# Patient Record
Sex: Female | Born: 1980 | Race: White | Hispanic: No | Marital: Single | Smoking: Never smoker
Health system: Southern US, Community
[De-identification: ages and names within clinical notes are randomized; demographics above are authoritative.]

## PROBLEM LIST (undated history)

## (undated) DIAGNOSIS — I00 Rheumatic fever without heart involvement: Secondary | ICD-10-CM

## (undated) DIAGNOSIS — I341 Nonrheumatic mitral (valve) prolapse: Secondary | ICD-10-CM

---

## 2019-08-30 ENCOUNTER — Other Ambulatory Visit: Payer: Self-pay

## 2019-08-30 ENCOUNTER — Emergency Department (HOSPITAL_COMMUNITY): Payer: PRIVATE HEALTH INSURANCE

## 2019-08-30 ENCOUNTER — Encounter (HOSPITAL_COMMUNITY): Payer: Self-pay | Admitting: Emergency Medicine

## 2019-08-30 ENCOUNTER — Emergency Department (HOSPITAL_COMMUNITY)
Admission: EM | Admit: 2019-08-30 | Discharge: 2019-08-30 | Disposition: A | Discharge status: Home / Self Care | Payer: PRIVATE HEALTH INSURANCE | Attending: Emergency Medicine | Admitting: Emergency Medicine

## 2019-08-30 DIAGNOSIS — I479 Paroxysmal tachycardia, unspecified: Secondary | ICD-10-CM | POA: Diagnosis not present

## 2019-08-30 DIAGNOSIS — R079 Chest pain, unspecified: Secondary | ICD-10-CM

## 2019-08-30 DIAGNOSIS — R002 Palpitations: Secondary | ICD-10-CM | POA: Diagnosis present

## 2019-08-30 DIAGNOSIS — R110 Nausea: Secondary | ICD-10-CM | POA: Diagnosis not present

## 2019-08-30 HISTORY — DX: Rheumatic fever without heart involvement: I00

## 2019-08-30 HISTORY — DX: Nonrheumatic mitral (valve) prolapse: I34.1

## 2019-08-30 LAB — BASIC METABOLIC PANEL
Anion gap: 8 (ref 5–15)
BUN: 12 mg/dL (ref 6–20)
CO2: 25 mmol/L (ref 22–32)
Calcium: 9.5 mg/dL (ref 8.9–10.3)
Chloride: 106 mmol/L (ref 98–111)
Creatinine, Ser: 0.99 mg/dL (ref 0.44–1.00)
GFR calc Af Amer: >60 mL/min (ref >60)
GFR calc non Af Amer: >60 mL/min (ref >60)
Glucose, Bld: 103 mg/dL — ABNORMAL HIGH (ref 70–99)
Potassium: 3.6 mmol/L (ref 3.5–5.1)
Sodium: 139 mmol/L (ref 135–145)

## 2019-08-30 LAB — CBC
HCT: 41.9 % (ref 36.0–46.0)
Hemoglobin: 13.8 g/dL (ref 12.0–15.0)
MCH: 31.2 pg (ref 26.0–34.0)
MCHC: 32.9 g/dL (ref 30.0–36.0)
MCV: 94.6 fL (ref 80.0–100.0)
Platelets: 198 10*3/uL (ref 150–400)
RBC: 4.43 MIL/uL (ref 3.87–5.11)
RDW: 11.8 % (ref 11.5–15.5)
WBC: 8 10*3/uL (ref 4.0–10.5)
nRBC: 0 % (ref 0.0–0.2)

## 2019-08-30 LAB — TROPONIN I (HIGH SENSITIVITY)
Troponin I (High Sensitivity): <2 ng/L (ref <18)
Troponin I (High Sensitivity): 3 ng/L (ref <18)

## 2019-08-30 LAB — TSH: TSH: 3.077 u[IU]/mL (ref 0.350–4.500)

## 2019-08-30 LAB — I-STAT BETA HCG BLOOD, ED (MC, WL, AP ONLY): I-stat hCG, quantitative: <5 m[IU]/mL (ref <5)

## 2019-08-30 LAB — T4, FREE: Free T4: 1.02 ng/dL (ref 0.61–1.12)

## 2019-08-30 LAB — PROTIME-INR
INR: 0.9 (ref 0.8–1.2)
Prothrombin Time: 12.2 seconds (ref 11.4–15.2)

## 2019-08-30 LAB — D-DIMER, QUANTITATIVE: D-Dimer, Quant: 0.54 ug/mL-FEU — ABNORMAL HIGH (ref 0.00–0.50)

## 2019-08-30 MED ORDER — SODIUM CHLORIDE 0.9% FLUSH: 3.0000 mL | Freq: Once | INTRAVENOUS | Status: DC

## 2019-08-30 MED ORDER — IOHEXOL 350 MG/ML SOLN
100.0000 mL | Freq: Once | INTRAVENOUS | Status: AC | PRN
  Administered 2019-08-30: 100 mL via INTRAVENOUS

## 2019-08-30 MED ORDER — SODIUM CHLORIDE 0.9 % IV BOLUS
1000.0000 mL | Freq: Once | INTRAVENOUS | Status: AC
  Administered 2019-08-30: 1000 mL via INTRAVENOUS

## 2019-08-30 NOTE — ED Triage Notes (Signed)
Patient woke up this morning with palpitations , intermittent  left chest pain for 1 month with SOB , denies emesis or diaphoresis , no cough or fever .

## 2019-08-30 NOTE — ED Provider Notes (Signed)
[facility] EMERGENCY DEPARTMENT Provider Note  CSN: [id number] Arrival date & time: 08/30/19 0250  Chief Complaint(s) Chest Pain / Palpitations  HPI Rebecca Hester is a 39 y.o. female   The history is provided by the patient.  Palpitations Palpitations quality:  Regular Onset quality:  At rest Duration: 2 months. Timing:  Intermittent Progression:  Waxing and waning Chronicity:  New Context: not anxiety, not appetite suppressants, not blood loss, not bronchodilators, not caffeine, not dehydration, not exercise, not hyperventilation, not illicit drugs and not stimulant use   Relieved by:  Nothing Worsened by:  Nothing Associated symptoms: chest pressure, dizziness, nausea and shortness of breath   Associated symptoms: no cough, no diaphoresis, no lower extremity edema, no vomiting and no weakness   Risk factors: no diabetes mellitus, no hx of PE, no hx of thyroid disease, no hyperthyroidism, no OTC sinus medications and no stress    Saw her PCP initially for the symptoms and had labs done noting low Vit D and B12. She has been getting B12 shots since.  Patient had mild URI vs allergic rhinitis symptoms 1 week ago.  Traveled to [place] just prior to onset of symptoms. No tick bites noted.  Past Medical History Past Medical History:  Diagnosis Date  . Mitral valve prolapse   . Rheumatic fever    There are no problems to display for this patient.  Home Medication(s) Prior to Admission medications   Medication Sig Start Date End Date Taking? Authorizing Provider  cyanocobalamin (,VITAMIN B-12,) 1000 MCG/ML injection Inject 1,000 mcg into the muscle every 30 (thirty) days. 07/30/19  Yes [provider]                                                                                                                                    Past Surgical History History reviewed. No pertinent surgical history. Family History No family history on  file.  Social History Social History   Tobacco Use  . Smoking status: Never Smoker  . Smokeless tobacco: Never Used  Substance Use Topics  . Alcohol use: Never  . Drug use: Never   Allergies Patient has no known allergies.  Review of Systems Review of Systems  Constitutional: Negative for diaphoresis.  Respiratory: Positive for shortness of breath. Negative for cough.   Cardiovascular: Positive for palpitations.  Gastrointestinal: Positive for nausea. Negative for vomiting.  Neurological: Positive for dizziness. Negative for weakness.   All other systems are reviewed and are negative for acute change except as noted in the HPI  Physical Exam Vital Signs  I have reviewed the triage vital signs BP 121/82   Pulse 102   Temp 99.1 F (37.3 C) (Oral)   Resp 16   Ht 5\' 6"  (1.676 m)   Wt 65 kg   LMP 08/29/2019   SpO2 100%   BMI 23.13 kg/m   Physical Exam Vitals reviewed.  Constitutional:  General: She is not in acute distress.    Appearance: She is well-developed. She is not diaphoretic.  HENT:     Head: Normocephalic and atraumatic.     Nose: Nose normal.  Eyes:     General: No scleral icterus.       Right eye: No discharge.        Left eye: No discharge.     Conjunctiva/sclera: Conjunctivae normal.     Pupils: Pupils are equal, round, and reactive to light.  Cardiovascular:     Rate and Rhythm: Regular rhythm. Tachycardia present.     Pulses:          Radial pulses are 2+ on the right side and 2+ on the left side.       Dorsalis pedis pulses are 2+ on the right side and 2+ on the left side.     Heart sounds: No murmur heard.  No friction rub. No gallop.   Pulmonary:     Effort: Pulmonary effort is normal. No respiratory distress.     Breath sounds: Normal breath sounds. No stridor. No rales.  Abdominal:     General: There is no distension.     Palpations: Abdomen is soft.     Tenderness: There is no abdominal tenderness.  Musculoskeletal:         General: No tenderness.     Cervical back: Normal range of motion and neck supple.  Skin:    General: Skin is warm and dry.     Findings: No erythema or rash.  Neurological:     Mental Status: She is alert and oriented to person, place, and time.     ED Results and Treatments Labs (all labs ordered are listed, but only abnormal results are displayed) Labs Reviewed  BASIC METABOLIC PANEL - Abnormal; Notable for the following components:      Result Value   Glucose, Bld 103 (*)    All other components within normal limits  D-DIMER, QUANTITATIVE (NOT AT [facility]) - Abnormal; Notable for the following components:   D-Dimer, Quant 0.54 (*)    All other components within normal limits  CBC  PROTIME-INR  T4, FREE  TSH  I-STAT BETA HCG BLOOD, ED ([facility], [facility], [facility] ONLY)  TROPONIN I (HIGH SENSITIVITY)  TROPONIN I (HIGH SENSITIVITY)                                                                                                                         EKG  EKG Interpretation  Date/Time:  Saturday August 30 2019 03:09:34 EDT Ventricular Rate:  93 PR Interval:  126 QRS Duration: 94 QT Interval:  372 QTC Calculation: 462 R Axis:   85 Text Interpretation: Normal sinus rhythm Normal ECG No old tracing to compare Confirmed by Drema Pry [phone]) on 08/30/2019 4:59:28 AM       EKG Interpretation  Date/Time:  Saturday August 30 2019 03:17:51 EDT Ventricular Rate:  142 PR Interval:  126 QRS Duration:  80 QT Interval:  288 QTC Calculation: 443 R Axis:   87 Text Interpretation: Sinus tachycardia Right atrial enlargement Possible Lateral infarct , age undetermined Possible Inferior infarct , age undetermined Abnormal ECG Confirmed by Drema Pryardama, Chase Knebel [phone]([id number]) on 08/30/2019 5:01:45 AM       Radiology CT Angio Chest PE W and/or Wo Contrast  Result Date: 08/30/2019 CLINICAL DATA:  Evaluate for pulmonary embolus. Positive D-dimer. Tachycardia and bradycardia. EXAM: CT ANGIOGRAPHY CHEST WITH CONTRAST  TECHNIQUE: Multidetector CT imaging of the chest was performed using the standard protocol during bolus administration of intravenous contrast. Multiplanar CT image reconstructions and MIPs were obtained to evaluate the vascular anatomy. CONTRAST:  100mL OMNIPAQUE IOHEXOL 350 MG/ML SOLN COMPARISON:  None. FINDINGS: Cardiovascular: Satisfactory opacification of the pulmonary arteries to the segmental level. No evidence of pulmonary embolism. Normal heart size. No pericardial effusion. Mediastinum/Nodes: Prominent thymus tissue identified within the anterior mediastinum. No mass identified. Normal appearance of the thyroid gland. The trachea appears patent and is midline. Normal appearance of the esophagus. No mediastinal or hilar adenopathy. Lungs/Pleura: No pleural effusion, airspace consolidation, or atelectasis. 3 mm nodule is identified within the lateral left upper lobe, image 33/8. Upper Abdomen: No acute abnormality. Musculoskeletal: No chest wall abnormality. No acute or significant osseous findings. Review of the MIP images confirms the above findings. IMPRESSION: 1. No evidence for acute pulmonary embolus. No acute cardiopulmonary abnormalities identified. 2. 3 mm left upper lobe lung nodule is identified. No follow-up needed if patient is low-risk. Non-contrast chest CT can be considered in 12 months if patient is high-risk. This recommendation follows the consensus statement: Guidelines for Management of Incidental Pulmonary Nodules Detected on CT Images: From the Fleischner Society 2017; Radiology 2017; 284:228-243. Electronically Signed   By: Signa Kellaylor  Stroud M.D.   On: 08/30/2019 05:43   DG Chest Port 1 View  Result Date: 08/30/2019 CLINICAL DATA:  Elevated heart rate and chest pain. EXAM: PORTABLE CHEST 1 VIEW COMPARISON:  None FINDINGS: The heart size and mediastinal contours are within normal limits. Both lungs are clear. The visualized skeletal structures are unremarkable. IMPRESSION: No active  disease. Electronically Signed   By: Signa Kellaylor  Stroud M.D.   On: 08/30/2019 04:50    Pertinent labs & imaging results that were available during my care of the patient were reviewed by me and considered in my medical decision making (see chart for details).  Medications Ordered in ED Medications  sodium chloride flush (NS) 0.9 % injection 3 mL (has no administration in time range)  sodium chloride 0.9 % bolus 1,000 mL (0 mLs Intravenous Stopped 08/30/19 0616)  iohexol (OMNIPAQUE) 350 MG/ML injection 100 mL (100 mLs Intravenous Contrast Given 08/30/19 0524)                                                                                                                                    Procedures .1-3 Lead EKG Interpretation Performed by: Eudelia Bunchardama,  Amadeo Garnet, MD Authorized by: Nira Conn, MD     Interpretation: abnormal     ECG rate:  70-160   ECG rate assessment: tachycardic     Rhythm: sinus tachycardia     Ectopy: none     Conduction: normal      (including critical care time)  Medical Decision Making / ED Course I have reviewed the nursing notes for this encounter and the patient's prior records (if available in EHR or on provided paperwork).   Rebecca Hester was evaluated in Emergency Department on 08/30/2019 for the symptoms described in the history of present illness. She was evaluated in the context of the global COVID-19 pandemic, which necessitated consideration that the patient might be at risk for infection with the SARS-CoV-2 virus that causes COVID-19. Institutional protocols and algorithms that pertain to the evaluation of patients at risk for COVID-19 are in a state of rapid change based on information released by regulatory bodies including the CDC and federal and state organizations. These policies and algorithms were followed during the patient's care in the ED.  Patient is intermittently tachycardic with rates up to the 160s. Appears to be sinus rhythm.  At times variant with respirations.  Vagal maneuvers unsuccessful in lowering heart rate.  Patient is having associated chest discomfort likely related to rate. Atypical for ACS. No ischemic changes on EKG with normal rate. No dysrhythmias or blocks. Initial troponin negative. Heart score less than 4. Will obtain a delta troponin.  Low suspicion for pulmonary embolism but patient had recent travel. Dimer mildly elevated. CTA negative for PE or infectious process. Incidental solitary nodule noted and patient made aware.  No significant electrolyte derangements or renal insufficiency. No leukocytosis or anemia. Thyroid panel within normal limits.  Patient was given IV fluids. Rate controlled after 1 L.  Delta troponin negative.  Patient reports that she already has appointment scheduled with cardiology.      Final Clinical Impression(s) / ED Diagnoses Final diagnoses:  Tachycardia, paroxysmal (HCC)   The patient appears reasonably screened and/or stabilized for discharge and I doubt any other medical condition or other [facility] requiring further screening, evaluation, or treatment in the ED at this time prior to discharge. Safe for discharge with strict return precautions.  Disposition: Discharge  Condition: Good  I have discussed the results, Dx and Tx plan with the patient/family who expressed understanding and agree(s) with the plan. Discharge instructions discussed at length. The patient/family was given strict return precautions who verbalized understanding of the instructions. No further questions at time of discharge.    ED Discharge Orders    None       Follow Up: Cardiology   as scheduled  Luisa Hart, FNP [address] [place]  [place] [address] [phone]  Schedule an appointment as soon as possible for a visit  If symptoms do not improve or  worsen      This chart was dictated using voice recognition software.  Despite best efforts to  proofread,  errors can occur which can change the documentation meaning.   Nira Conn, MD 08/30/19 [phone]

## 2019-08-30 NOTE — ED Notes (Signed)
I-STAT Beta <5.0 IU/L

## 2019-08-30 NOTE — ED Notes (Signed)
Pt given water per provider.

## 2021-06-28 IMAGING — CT CT ANGIO CHEST
2 of 6 series · 18 of 46 positions shown · IV contrast (APPLIED)
Comparison: None.

CLINICAL DATA: Evaluate for pulmonary embolus. Positive D-dimer.
Tachycardia and bradycardia.

EXAM:
CT ANGIOGRAPHY CHEST WITH CONTRAST
TECHNIQUE: Multidetector CT imaging of the chest was performed using the
standard protocol during bolus administration of intravenous
contrast. Multiplanar CT image reconstructions and MIPs were
obtained to evaluate the vascular anatomy.
CONTRAST:  100mL OMNIPAQUE IOHEXOL 350 MG/ML SOLN

[Series 9: thins · axial · 0.66mm/px · z∈[+1302,+1549]mm · 15 of 387 slices shown]
[im 17/387  lung]
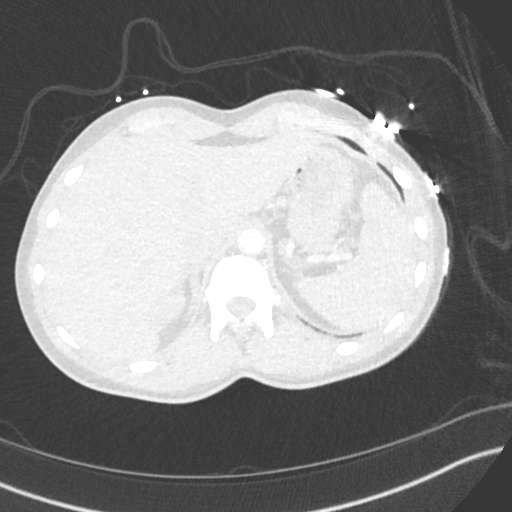
[im 51/387  soft-tissue]
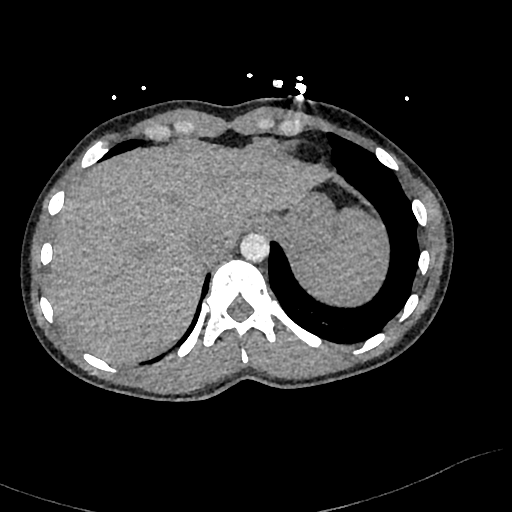
[im 68/387  lung]
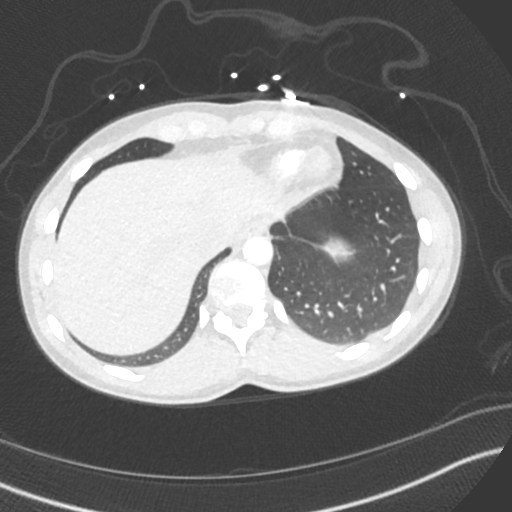
[im 101/387  soft-tissue]
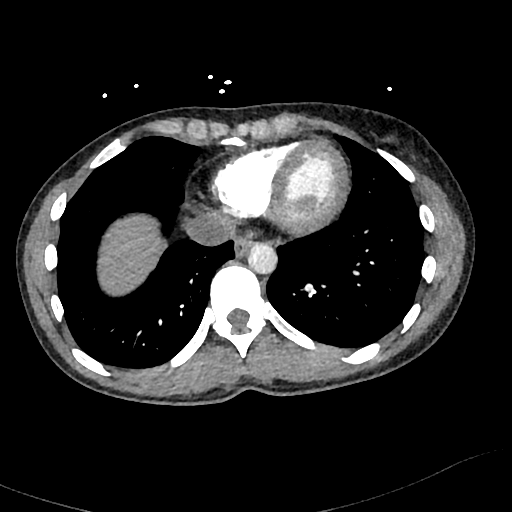
[im 118/387  lung]
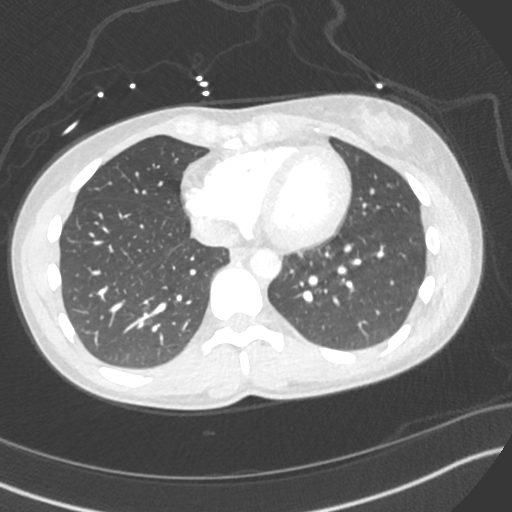
[im 152/387  soft-tissue]
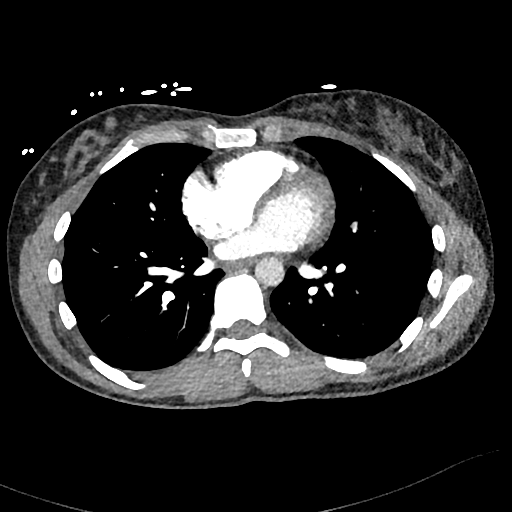
[im 168/387  lung]
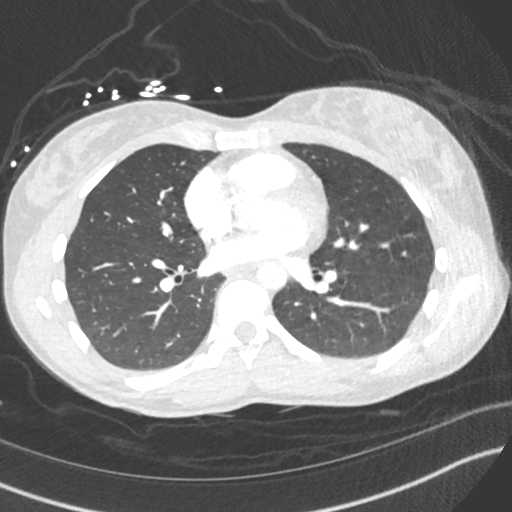
[im 202/387  soft-tissue]
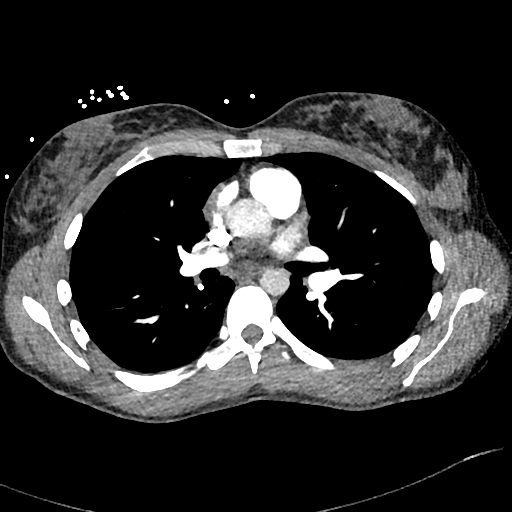
[im 219/387  lung]
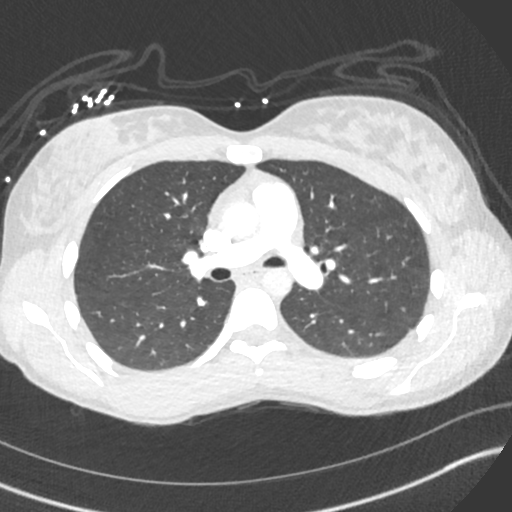
[im 235/387  soft-tissue]
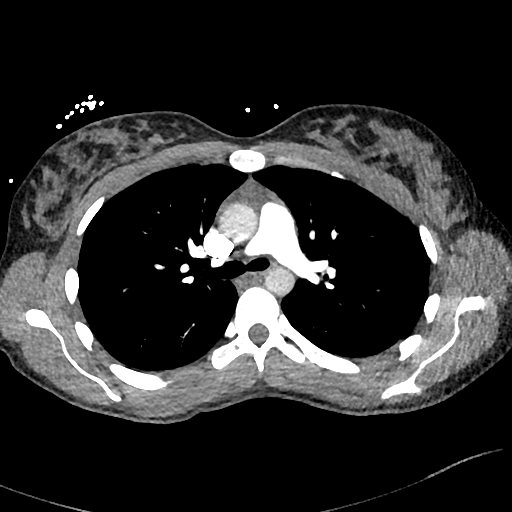
[im 269/387  lung]
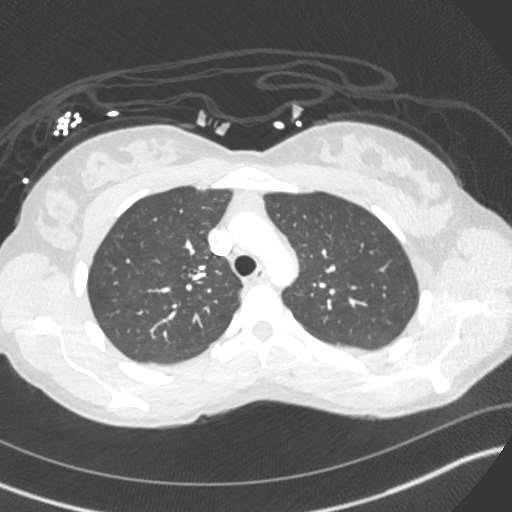
[im 286/387  soft-tissue]
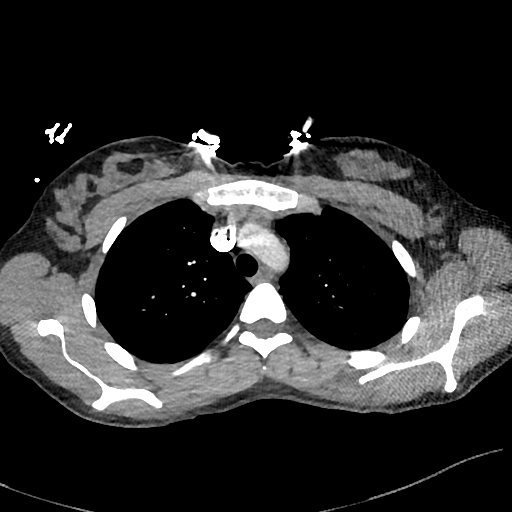
[im 319/387  lung]
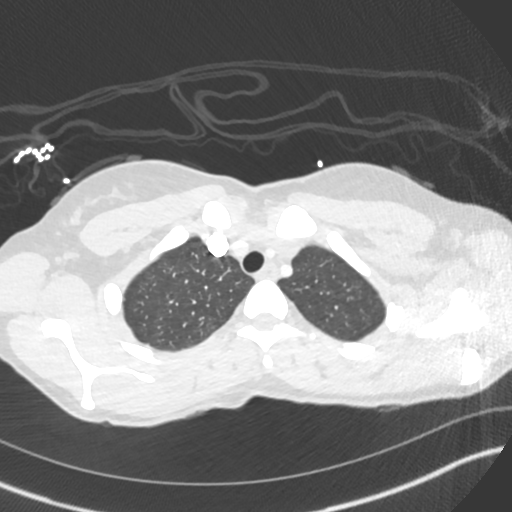
[im 336/387  soft-tissue]
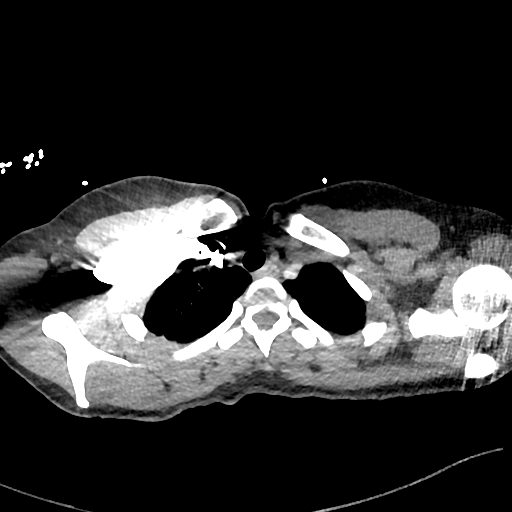
[im 370/387  lung]
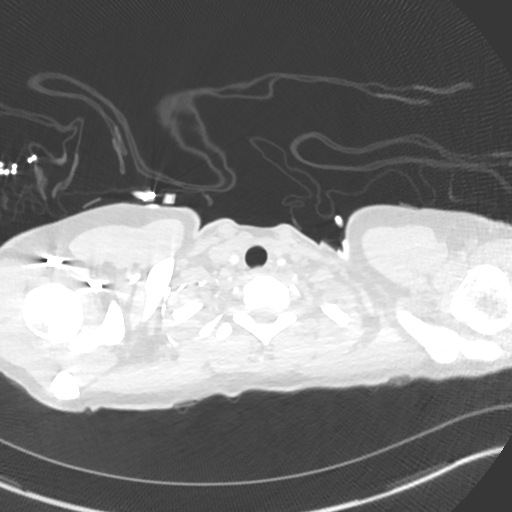

[Series 10: cor · coronal · 0.55mm/px · 3 of 111 slices shown]
[im 28/111  soft-tissue]
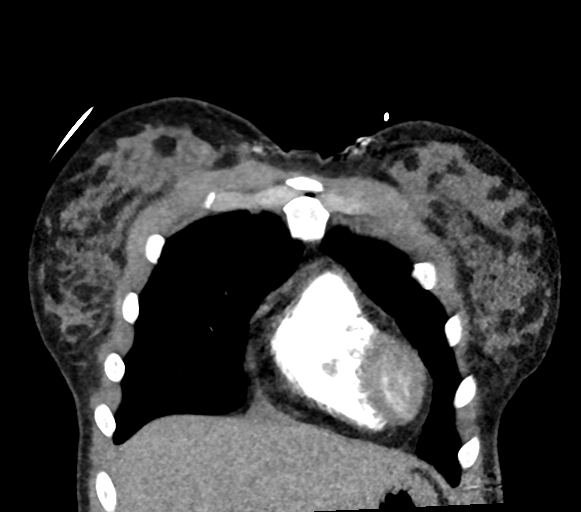
[im 56/111  soft-tissue]
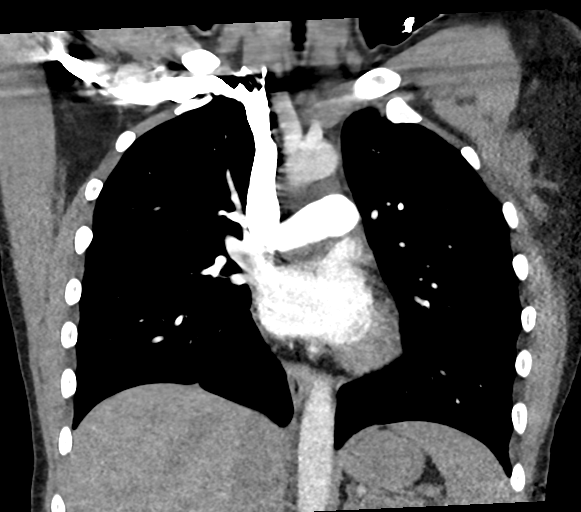
[im 83/111  soft-tissue]
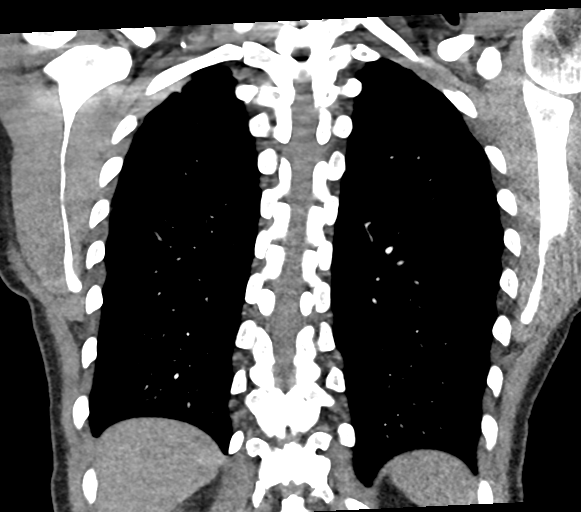

[18 of 46 positions shown; findings below may reference images not displayed]

FINDINGS: Cardiovascular: Satisfactory opacification of the pulmonary arteries
to the segmental level. No evidence of pulmonary embolism. Normal
heart size. No pericardial effusion.

Mediastinum/Nodes: Prominent thymus tissue identified within the
anterior mediastinum. No mass identified. Normal appearance of the
thyroid gland. The trachea appears patent and is midline. Normal
appearance of the esophagus. No mediastinal or hilar adenopathy.

Lungs/Pleura: No pleural effusion, airspace consolidation, or
atelectasis. 3 mm nodule is identified within the lateral left upper
lobe, image 33/8.

Upper Abdomen: No acute abnormality.

Musculoskeletal: No chest wall abnormality. No acute or significant
osseous findings.

Review of the MIP images confirms the above findings.
IMPRESSION: 1. No evidence for acute pulmonary embolus. No acute cardiopulmonary
abnormalities identified.
2. 3 mm left upper lobe lung nodule is identified. No follow-up
needed if patient is low-risk. Non-contrast chest CT can be
considered in 12 months if patient is high-risk. This recommendation
follows the consensus statement: Guidelines for Management of
Incidental Pulmonary Nodules Detected on CT Images: From the

## 2021-06-28 IMAGING — DX DG CHEST 1V PORT
1 series · 1 of 1 positions shown · non-contrast
Comparison: None

CLINICAL DATA: Elevated heart rate and chest pain.

EXAM:
PORTABLE CHEST 1 VIEW

[chest ap]
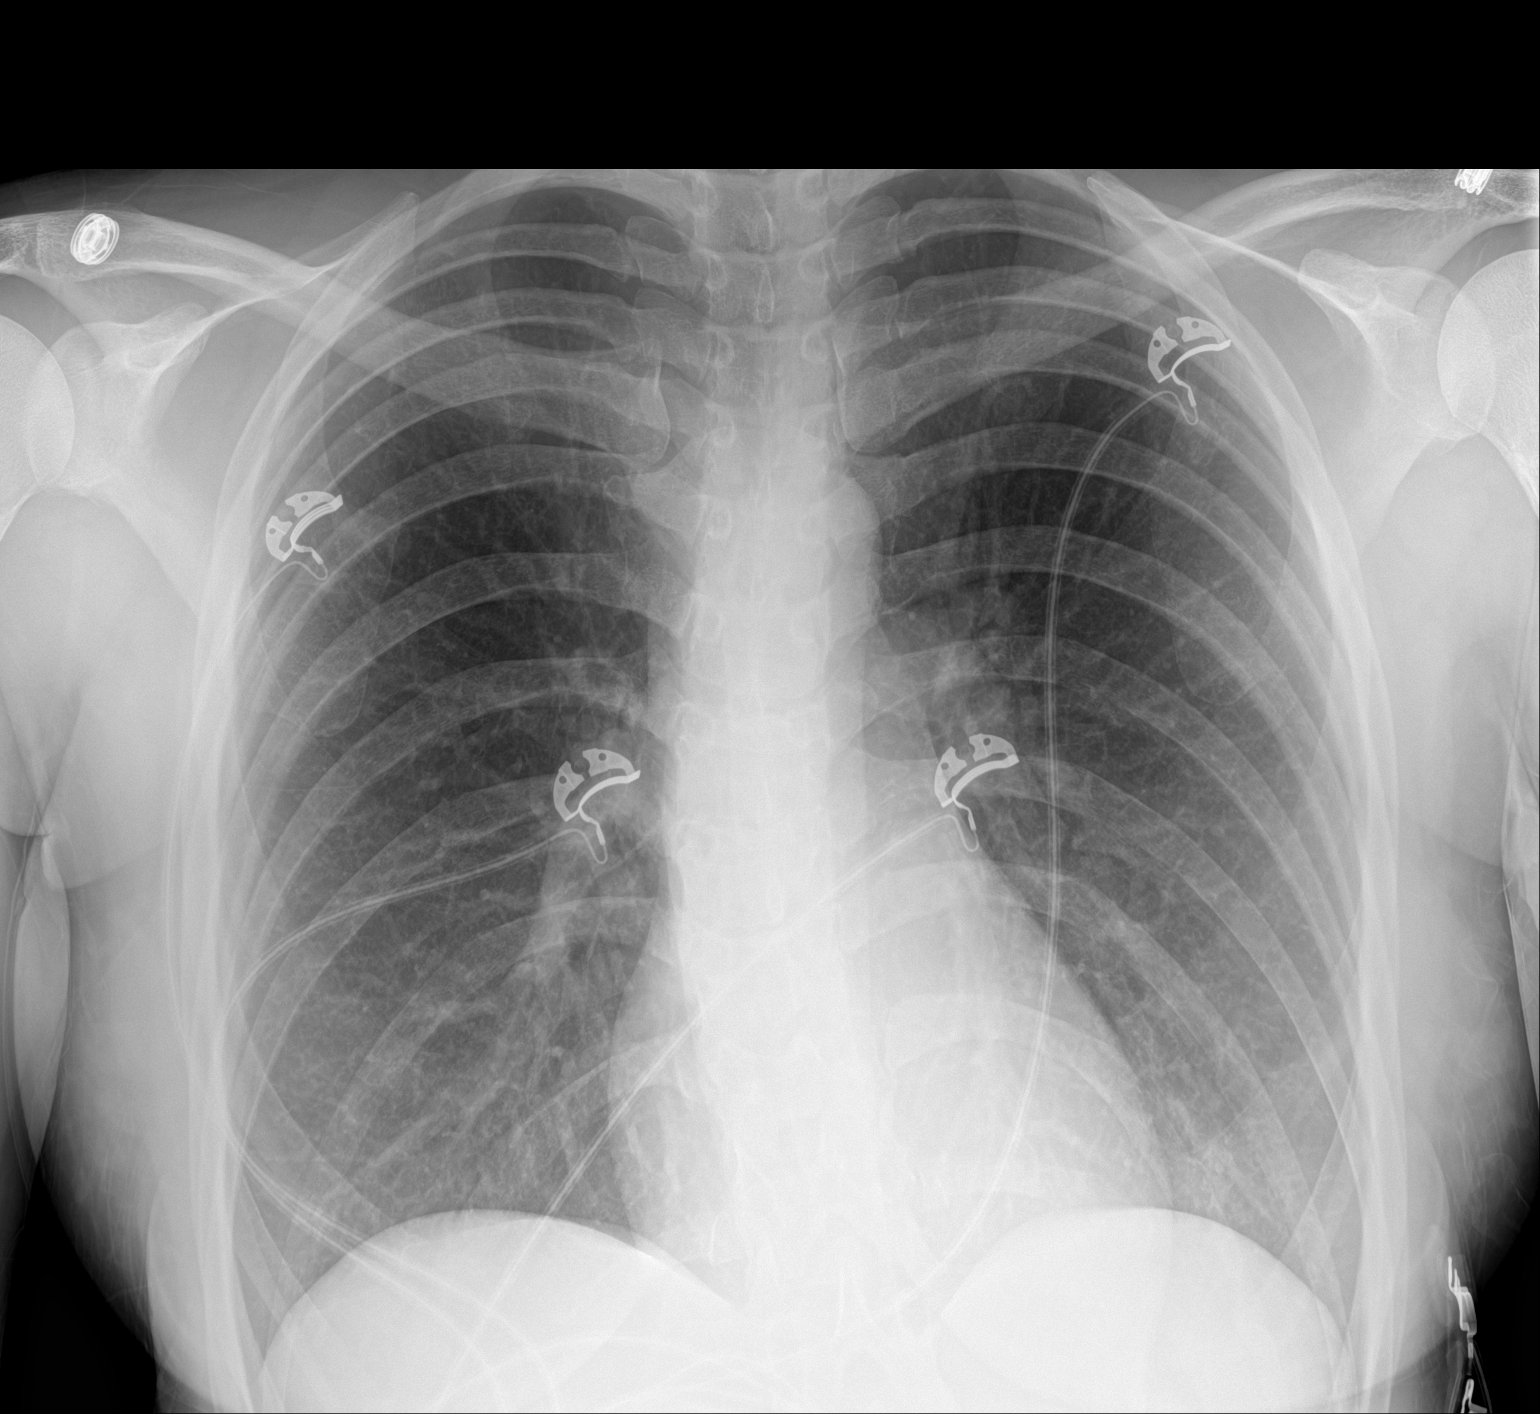

[1 of 1 positions shown; findings below may reference images not displayed]

FINDINGS: The heart size and mediastinal contours are within normal limits.
Both lungs are clear. The visualized skeletal structures are
unremarkable.
IMPRESSION: No active disease.
